# Patient Record
Sex: Female | Born: 1995 | Race: Black or African American | Hispanic: No | Marital: Married | Smoking: Current every day smoker
Health system: Southern US, Community
[De-identification: ages and names within clinical notes are randomized; demographics above are authoritative.]

## PROBLEM LIST (undated history)

## (undated) DIAGNOSIS — K469 Unspecified abdominal hernia without obstruction or gangrene: Secondary | ICD-10-CM

## (undated) DIAGNOSIS — F410 Panic disorder [episodic paroxysmal anxiety] without agoraphobia: Secondary | ICD-10-CM

---

## 2017-08-07 ENCOUNTER — Ambulatory Visit: Payer: 59 | Admitting: Podiatry

## 2017-08-07 DIAGNOSIS — L600 Ingrowing nail: Secondary | ICD-10-CM | POA: Diagnosis not present

## 2017-08-07 MED ORDER — GENTAMICIN SULFATE 0.1 % EX CREA: 1.0000 "application " | TOPICAL_CREAM | Freq: Three times a day (TID) | CUTANEOUS | 1 refills | Status: AC

## 2017-08-07 NOTE — Patient Instructions (Signed)

## 2017-08-13 NOTE — Progress Notes (Signed)
   Subjective: Patient presents today for evaluation of pain to the medial and lateral borders of the bilateral great toes that began several weeks ago. Patient is concerned for possible ingrown nail. She has been trimming the nails herself for treatment until the pain became too great to continue to do so. Wearing certain shoes increases the pain. Patient presents today for further treatment and evaluation.  No past medical history on file.  Objective:  General: Well developed, nourished, in no acute distress, alert and oriented x3   Dermatology: Skin is warm, dry and supple bilateral. Medial and lateral borders of bilateral great toes appears to be erythematous with evidence of an ingrowing nail. Pain on palpation noted to the border of the nail fold. The remaining nails appear unremarkable at this time. There are no open sores, lesions.  Vascular: Dorsalis Pedis artery and Posterior Tibial artery pedal pulses palpable. No lower extremity edema noted.   Neruologic: Grossly intact via light touch bilateral.  Musculoskeletal: Muscular strength within normal limits in all groups bilateral. Normal range of motion noted to all pedal and ankle joints.   Assesement: #1 Paronychia with ingrowing nail medial and lateral borders bilateral great toes  #2 Pain in toe #3 Incurvated nail  Plan of Care:  1. Patient evaluated.  2. Discussed treatment alternatives and plan of care. Explained nail avulsion procedure and post procedure course to patient. 3. Patient opted for permanent partial nail avulsion to medial and lateral borders bilateral great toes.  4. Prior to procedure, local anesthesia infiltration utilized using 3 ml of a 50:50 mixture of 2% plain lidocaine and 0.5% plain marcaine in a normal hallux block fashion and a betadine prep performed.  5. Partial permanent nail avulsion with chemical matrixectomy performed using 3x30sec applications of phenol followed by alcohol flush.  6. Light  dressing applied. 7. Prescription for gentamicin cream provided to patient.  8. Return to clinic in 2 weeks.   Felecia ShellingBrent M. Teagon Kron, DPM [facility]  Dr. Felecia ShellingBrent M. Zachory Mangual, DPM    [address]                                        [place], [place] [address]                Office [phone]  Fax [phone]

## 2017-08-21 ENCOUNTER — Encounter: Payer: 59 | Admitting: Podiatry

## 2017-08-22 NOTE — Progress Notes (Signed)
This encounter was created in error - please disregard.

## 2018-01-23 ENCOUNTER — Telehealth: Payer: Self-pay | Admitting: Neurology

## 2018-01-23 ENCOUNTER — Ambulatory Visit: Payer: 59 | Admitting: Neurology

## 2018-01-23 NOTE — Telephone Encounter (Signed)
This patient did not show for a NP appointment today. 

## 2018-01-24 ENCOUNTER — Encounter: Payer: Self-pay | Admitting: Neurology

## 2018-01-27 ENCOUNTER — Emergency Department (HOSPITAL_COMMUNITY)
Admission: EM | Admit: 2018-01-27 | Discharge: 2018-01-27 | Disposition: A | Discharge status: Home / Self Care | Payer: 59 | Attending: Emergency Medicine | Admitting: Emergency Medicine

## 2018-01-27 ENCOUNTER — Other Ambulatory Visit: Payer: Self-pay

## 2018-01-27 ENCOUNTER — Emergency Department (HOSPITAL_COMMUNITY): Payer: 59

## 2018-01-27 ENCOUNTER — Encounter (HOSPITAL_COMMUNITY): Payer: Self-pay | Admitting: Emergency Medicine

## 2018-01-27 DIAGNOSIS — M2550 Pain in unspecified joint: Secondary | ICD-10-CM

## 2018-01-27 DIAGNOSIS — M25562 Pain in left knee: Secondary | ICD-10-CM | POA: Diagnosis not present

## 2018-01-27 DIAGNOSIS — R531 Weakness: Secondary | ICD-10-CM | POA: Diagnosis present

## 2018-01-27 DIAGNOSIS — Z79899 Other long term (current) drug therapy: Secondary | ICD-10-CM | POA: Diagnosis not present

## 2018-01-27 DIAGNOSIS — D72829 Elevated white blood cell count, unspecified: Secondary | ICD-10-CM | POA: Insufficient documentation

## 2018-01-27 HISTORY — DX: Unspecified abdominal hernia without obstruction or gangrene: K46.9

## 2018-01-27 HISTORY — DX: Panic disorder (episodic paroxysmal anxiety): F41.0

## 2018-01-27 LAB — BASIC METABOLIC PANEL
Anion gap: 9 (ref 5–15)
BUN: 9 mg/dL (ref 6–20)
CHLORIDE: 106 mmol/L (ref 98–111)
CO2: 22 mmol/L (ref 22–32)
Calcium: 8.9 mg/dL (ref 8.9–10.3)
Creatinine, Ser: 0.86 mg/dL (ref 0.44–1.00)
GFR calc non Af Amer: >60 mL/min (ref >60)
Glucose, Bld: 93 mg/dL (ref 70–99)
POTASSIUM: 4 mmol/L (ref 3.5–5.1)
SODIUM: 137 mmol/L (ref 135–145)

## 2018-01-27 LAB — CBC WITH DIFFERENTIAL/PLATELET
Abs Immature Granulocytes: 0.06 10*3/uL (ref 0.00–0.07)
Basophils Absolute: 0 10*3/uL (ref 0.0–0.1)
Basophils Relative: 0 %
EOS PCT: 0 %
Eosinophils Absolute: 0 10*3/uL (ref 0.0–0.5)
HCT: 42 % (ref 36.0–46.0)
Hemoglobin: 13.7 g/dL (ref 12.0–15.0)
Immature Granulocytes: 0 %
Lymphocytes Relative: 18 %
Lymphs Abs: 2.4 10*3/uL (ref 0.7–4.0)
MCH: 30.1 pg (ref 26.0–34.0)
MCHC: 32.6 g/dL (ref 30.0–36.0)
MCV: 92.3 fL (ref 80.0–100.0)
Monocytes Absolute: 1.1 10*3/uL — ABNORMAL HIGH (ref 0.1–1.0)
Monocytes Relative: 8 %
Neutro Abs: 9.9 10*3/uL — ABNORMAL HIGH (ref 1.7–7.7)
Neutrophils Relative %: 74 %
PLATELETS: 280 10*3/uL (ref 150–400)
RBC: 4.55 MIL/uL (ref 3.87–5.11)
RDW: 11.7 % (ref 11.5–15.5)
WBC: 13.6 10*3/uL — ABNORMAL HIGH (ref 4.0–10.5)
nRBC: 0 % (ref 0.0–0.2)

## 2018-01-27 LAB — URINALYSIS, ROUTINE W REFLEX MICROSCOPIC
Bilirubin Urine: NEGATIVE
Glucose, UA: NEGATIVE mg/dL
Hgb urine dipstick: NEGATIVE
Ketones, ur: 5 mg/dL — AB
Leukocytes, UA: NEGATIVE
Nitrite: NEGATIVE
Protein, ur: 100 mg/dL — AB
Specific Gravity, Urine: 1.028 (ref 1.005–1.030)
pH: 7 (ref 5.0–8.0)

## 2018-01-27 LAB — I-STAT BETA HCG BLOOD, ED (MC, WL, AP ONLY): I-stat hCG, quantitative: <5 m[IU]/mL (ref <5)

## 2018-01-27 LAB — TSH: TSH: 1.667 u[IU]/mL (ref 0.350–4.500)

## 2018-01-27 NOTE — Discharge Instructions (Addendum)
Please see the information and instructions below regarding your visit.  Your diagnoses today include:  1. Generalized weakness   2. Polyarthralgia   3. Leukocytosis, unspecified type     Tests performed today include: See side panel of your discharge paperwork for testing performed today. Vital signs are listed at the bottom of these instructions.   Your work-up is very reassuring today.  Your thyroid function is normal.  Your electrolytes are normal.  Your white blood cell count is slightly high, which can be a stress reaction, however I would like this rechecked by your primary care provider.  It is 13.6 today.  In the setting of no infection, this is unlikely to represent infectious process.  Medications prescribed:    Take any prescribed medications only as prescribed, and any over the counter medications only as directed on the packaging.  Please do not self taper or discontinue Wellbutrin without first talking to your primary care provider.  Home care instructions:  Please follow any educational materials contained in this packet.   Please wear the knee sleeve at all times with activity.  This is hot to help support the knee.  I listed a contact of an orthopedic physician to follow-up with you should you have ongoing pain or problems with your knee in 1 week.  Follow-up instructions: Please follow-up with your primary care provider in this coming week for further evaluation of your symptoms if they are not completely improved.   Please follow up with orthopedic in one week for knee pain.  Return instructions:  Please return to the Emergency Department if you experience worsening symptoms.  Please return to the emergency department if you develop any worsening symptoms, swelling or redness of the joints, fever with your symptoms, or passing out spells where you completely lose consciousness. Please return if you have any other emergent concerns.  Additional  Information:   Your vital signs today were: BP 135/71 (BP Location: Right Arm)    Pulse 74    Temp 98.4 F (36.9 C) (Oral)    Resp 16    Ht 5\' 2"  (1.575 m)    Wt 67.6 kg    LMP 12/27/2017    SpO2 99%    BMI 27.25 kg/m  If your blood pressure (BP) was elevated on multiple readings during this visit above 130 for the top number or above 80 for the bottom number, please have this repeated by your primary care provider within one month. --------------  Thank you for allowing us to participate in your care today.

## 2018-01-27 NOTE — ED Notes (Signed)
Pt stable and ambulatory for discharge, states understanding follow up.  

## 2018-01-27 NOTE — ED Triage Notes (Addendum)
Per Pt 3 weeks ago pt started on Wellbutrin for ADHD, pt has h/s of anxiety attacks, pt has been feeling fatigued, anxious, vision is blurry, scabs on face, hot flashes, and stomach pain.

## 2018-01-27 NOTE — ED Provider Notes (Addendum)
[facility] EMERGENCY DEPARTMENT Provider Note   CSN: [id number] Arrival date & time: 01/27/18  1619     History   Chief Complaint Chief Complaint  Patient presents with  . Fatigue    HPI Jennell Janosik is a 22 y.o. female.  HPI  Patient is a 22 year old female with a history of ADHD presenting for generalized weakness, episodes of "blacking out" that she describes as disorientation and blinking, palpitations, irritability, crying spells, nausea without vomiting, vision changes in the setting of her "blacking out" spells.  Patient reports that all the symptoms began after starting Wellbutrin 3 weeks ago.  She reports that she was started on this for ADHD.  Patient reports she has previously been on 2 other antidepressants, hydroxyzine and Lexapro.  Patient denies any history of seizures or syncope.  Patient is also noting joint pain in several of her joints.  She denies any erythema or edema, but does note an injury to her left knee when her left knee "gave out" a couple days ago, hearing a "popping" inside her knee. Patient has been limping ever since. She denies experiencing any syncopal episodes as result of the symptoms.  She reports that she was placed on Wellbutrin by primary care provider with whom she establish care at [facility].  Patient does report there is a chance she could be pregnant.  History reviewed. No pertinent past medical history.  There are no active problems to display for this patient.   History reviewed. No pertinent surgical history.   OB History   None      Home Medications    Prior to Admission medications   Medication Sig Start Date End Date Taking? Authorizing Provider  gentamicin cream (GARAMYCIN) 0.1 % Apply 1 application topically 3 (three) times daily. 08/07/17   Felecia Shelling, DPM    Family History No family history on file.  Social History Social History   Tobacco Use  . Smoking status: Not on file    Substance Use Topics  . Alcohol use: Not on file  . Drug use: Not on file     Allergies   Bentyl [dicyclomine]   Review of Systems Review of Systems  Constitutional: Positive for chills. Negative for fever.  HENT: Negative for congestion, rhinorrhea, sinus pain and sore throat.   Eyes: Negative for visual disturbance.  Respiratory: Negative for cough, chest tightness and shortness of breath.   Cardiovascular: Negative for chest pain and palpitations.  Gastrointestinal: Positive for nausea. Negative for abdominal pain, constipation, diarrhea and vomiting.  Genitourinary: Negative for dysuria and flank pain.  Musculoskeletal: Negative for back pain and myalgias.  Skin: Negative for rash.  Neurological: Positive for light-headedness and headaches. Negative for dizziness, seizures, syncope and numbness.     Physical Exam Updated Vital Signs Ht 5\' 2"  (1.575 m)   Wt 67.6 kg   LMP 12/28/2017   BMI 27.25 kg/m   Physical Exam  Constitutional: She appears well-developed and well-nourished. No distress.  HENT:  Head: Normocephalic and atraumatic.  Mouth/Throat: Oropharynx is clear and moist.  Eyes: Pupils are equal, round, and reactive to light. Conjunctivae and EOM are normal.  Neck: Normal range of motion. Neck supple.  Cardiovascular: Normal rate, regular rhythm, S1 normal and S2 normal.  No murmur heard. Pulmonary/Chest: Effort normal and breath sounds normal. She has no wheezes. She has no rales.  Abdominal: Soft. She exhibits no distension. There is no tenderness. There is no guarding.  Musculoskeletal: Normal range  of motion. She exhibits no edema or deformity.  Left knee with tenderness to palpation of medial joint. Full ROM. No joint line tenderness. No joint effusion or swelling appreciated. No abnormal alignment or patellar mobility. No bruising, erythema or warmth overlaying the joint. No varus/valgus laxity. Negative drawer's, Lachman's and McMurray's.  No crepitus.   2+ DP pulses bilaterally. All compartments are soft. Sensation intact distal to injury.   Neurological: She is alert.  Cranial nerves grossly intact. Patient moves extremities symmetrically and with good coordination.  Skin: Skin is warm and dry. No rash noted. No erythema.  Psychiatric: She has a normal mood and affect. Her behavior is normal. Judgment and thought content normal.  Nursing note and vitals reviewed.    ED Treatments / Results  Labs (all labs ordered are listed, but only abnormal results are displayed) Labs Reviewed - No data to display  EKG None  Radiology No results found.  Procedures Procedures (including critical care time)  Medications Ordered in ED Medications - No data to display   Initial Impression / Assessment and Plan / ED Course  I have reviewed the triage vital signs and the nursing notes.  Pertinent labs & imaging results that were available during my care of the patient were reviewed by me and considered in my medical decision making (see chart for details).  Clinical Course as of Jan 27 2002  Sat Jan 27, 2018  1731 Does not appear c/w infection.   Urinalysis, Routine w reflex microscopic(!) [AM]    Clinical Course User Index [AM] Aviva KluverMurray, Chani Ghanem B, PA-C    Patient nontoxic-appearing, afebrile, hemodynamically stable, and in no acute distress.  Differential diagnosis includes medication reaction to Wellbutrin, electrolyte abnormality, pregnancy, viral illness, depression.  Lab work is largely unrevealing.  Patient has slight leukocytosis to 13.6.  No prior for comparison.  No focal infectious symptoms.  Recommended follow-up with PCP for this finding.  Electrolytes are normal.  TSH is within normal limits.  Pregnancy test negative. Urinalysis not consistent with infection.  X-ray of the left knee is without acute bony abnormality.  Patient placed in knee sleeve.  At this time, I recommended to patient that given that her symptoms seem to  correlate with starting Wellbutrin, recommend following up with her primary care provider who prescribed this closely for a symptom recheck and discussion of this is the best medication for her.  I discussed with the patient that this should not be self discontinued or discontinued abruptly, as that can cause worsening mental status.  Recommended follow-up for recheck of CBC.  Return precautions given for any worsening symptoms, swelling or redness of the joints, fever with your symptoms, syncope or presyncope.  Patient is in understanding and agrees with the plan of care.  Final Clinical Impressions(s) / ED Diagnoses   Final diagnoses:  Generalized weakness  Polyarthralgia  Leukocytosis, unspecified type    ED Discharge Orders    None       Elisha PonderMurray, Ayad Nieman B, PA-C 01/28/18 0028    Elisha PonderMurray, Lola Czerwonka B, PA-C 01/28/18 Ermalinda Barrios0028    Pickering, Nathan, MD 01/29/18 [phone]

## 2018-08-29 ENCOUNTER — Emergency Department (HOSPITAL_COMMUNITY)
Admission: EM | Admit: 2018-08-29 | Discharge: 2018-08-29 | Disposition: A | Discharge status: Home / Self Care | Payer: No Typology Code available for payment source | Attending: Emergency Medicine | Admitting: Emergency Medicine

## 2018-08-29 ENCOUNTER — Encounter (HOSPITAL_COMMUNITY): Payer: Self-pay | Admitting: *Deleted

## 2018-08-29 ENCOUNTER — Other Ambulatory Visit: Payer: Self-pay

## 2018-08-29 DIAGNOSIS — T782XXA Anaphylactic shock, unspecified, initial encounter: Secondary | ICD-10-CM | POA: Insufficient documentation

## 2018-08-29 DIAGNOSIS — F17200 Nicotine dependence, unspecified, uncomplicated: Secondary | ICD-10-CM | POA: Insufficient documentation

## 2018-08-29 DIAGNOSIS — Z79899 Other long term (current) drug therapy: Secondary | ICD-10-CM | POA: Insufficient documentation

## 2018-08-29 DIAGNOSIS — L509 Urticaria, unspecified: Secondary | ICD-10-CM | POA: Diagnosis present

## 2018-08-29 LAB — I-STAT BETA HCG BLOOD, ED (MC, WL, AP ONLY): I-stat hCG, quantitative: <5 m[IU]/mL (ref <5)

## 2018-08-29 LAB — CBC WITH DIFFERENTIAL/PLATELET
Abs Immature Granulocytes: 0.15 10*3/uL — ABNORMAL HIGH (ref 0.00–0.07)
Basophils Absolute: 0.1 10*3/uL (ref 0.0–0.1)
Basophils Relative: 1 %
Eosinophils Absolute: 0.1 10*3/uL (ref 0.0–0.5)
Eosinophils Relative: 1 %
HCT: 45.9 % (ref 36.0–46.0)
Hemoglobin: 15.1 g/dL — ABNORMAL HIGH (ref 12.0–15.0)
Immature Granulocytes: 1 %
Lymphocytes Relative: 27 %
Lymphs Abs: 2.8 10*3/uL (ref 0.7–4.0)
MCH: 30.8 pg (ref 26.0–34.0)
MCHC: 32.9 g/dL (ref 30.0–36.0)
MCV: 93.5 fL (ref 80.0–100.0)
Monocytes Absolute: 1.2 10*3/uL — ABNORMAL HIGH (ref 0.1–1.0)
Monocytes Relative: 11 %
Neutro Abs: 6.1 10*3/uL (ref 1.7–7.7)
Neutrophils Relative %: 59 %
Platelets: 287 10*3/uL (ref 150–400)
RBC: 4.91 MIL/uL (ref 3.87–5.11)
RDW: 11.9 % (ref 11.5–15.5)
WBC: 10.4 10*3/uL (ref 4.0–10.5)
nRBC: 0 % (ref 0.0–0.2)

## 2018-08-29 LAB — BASIC METABOLIC PANEL
Anion gap: 11 (ref 5–15)
BUN: 10 mg/dL (ref 6–20)
CO2: 21 mmol/L — ABNORMAL LOW (ref 22–32)
Calcium: 9.2 mg/dL (ref 8.9–10.3)
Chloride: 105 mmol/L (ref 98–111)
Creatinine, Ser: 0.78 mg/dL (ref 0.44–1.00)
GFR calc Af Amer: >60 mL/min (ref >60)
GFR calc non Af Amer: >60 mL/min (ref >60)
Glucose, Bld: 85 mg/dL (ref 70–99)
Potassium: 3.7 mmol/L (ref 3.5–5.1)
Sodium: 137 mmol/L (ref 135–145)

## 2018-08-29 MED ORDER — FAMOTIDINE 20 MG PO TABS: 20.0000 mg | ORAL_TABLET | Freq: Two times a day (BID) | ORAL | 0 refills | Status: AC

## 2018-08-29 MED ORDER — ONDANSETRON HCL 4 MG/2ML IJ SOLN
4.0000 mg | Freq: Once | INTRAMUSCULAR | Status: AC
  Administered 2018-08-29: 4 mg via INTRAVENOUS
  Filled 2018-08-29: qty 2

## 2018-08-29 MED ORDER — EPINEPHRINE 0.3 MG/0.3ML IJ SOAJ: 0.3000 mg | INTRAMUSCULAR | 0 refills | Status: AC | PRN

## 2018-08-29 MED ORDER — DEXAMETHASONE SODIUM PHOSPHATE 10 MG/ML IJ SOLN
10.0000 mg | Freq: Once | INTRAMUSCULAR | Status: AC
  Administered 2018-08-29: 10 mg via INTRAVENOUS
  Filled 2018-08-29: qty 1

## 2018-08-29 MED ORDER — SODIUM CHLORIDE 0.9 % IV SOLN
INTRAVENOUS | Status: DC
  Administered 2018-08-29: 15:00:00 via INTRAVENOUS

## 2018-08-29 MED ORDER — FAMOTIDINE IN NACL 20-0.9 MG/50ML-% IV SOLN
20.0000 mg | Freq: Once | INTRAVENOUS | Status: AC
  Administered 2018-08-29: 20 mg via INTRAVENOUS
  Filled 2018-08-29: qty 50

## 2018-08-29 MED ORDER — DIPHENHYDRAMINE HCL 25 MG PO TABS: 25.0000 mg | ORAL_TABLET | Freq: Four times a day (QID) | ORAL | 0 refills | Status: AC | PRN

## 2018-08-29 MED ORDER — SODIUM CHLORIDE 0.9 % IV BOLUS
1000.0000 mL | Freq: Once | INTRAVENOUS | Status: AC
  Administered 2018-08-29: 1000 mL via INTRAVENOUS

## 2018-08-29 MED ORDER — PREDNISONE 20 MG PO TABS: 40.0000 mg | ORAL_TABLET | Freq: Every day | ORAL | 0 refills | Status: AC

## 2018-08-29 MED ORDER — EPINEPHRINE 0.3 MG/0.3ML IJ SOAJ
0.3000 mg | Freq: Once | INTRAMUSCULAR | Status: AC
  Administered 2018-08-29: 0.3 mg via INTRAMUSCULAR
  Filled 2018-08-29: qty 0.3

## 2018-08-29 MED ORDER — DIPHENHYDRAMINE HCL 50 MG/ML IJ SOLN
25.0000 mg | Freq: Once | INTRAMUSCULAR | Status: AC
  Administered 2018-08-29: 25 mg via INTRAVENOUS
  Filled 2018-08-29: qty 1

## 2018-08-29 NOTE — ED Provider Notes (Signed)
[facility]-EMERGENCY DEPT Provider Note   CSN: [id number] Arrival date & time: 08/29/18  1245  History   Chief Complaint Chief Complaint  Patient presents with  . Allergic Reaction    HPI Maureen Medina is a 23 y.o. female with past medical history significant for multiple food allergies with anaphylaxis who presents for evaluation of allergic reaction.  Patient states she ate a chocolate cookie approximately 1 hour PTA and developed hives, shortness of breath, sensation of throat closing.  She has had one episode of emesis with persistent nausea.  She is also had generalized abdominal pain.  States she has prior history of anaphylaxis however does not have an EpiPen at home.  States she did take Benadryl, 1 tablet ETA.  Denies fever, chills, chest pain, shortness of breath, diarrhea, dysuria.  Denies additional aggravating or alleviating factors.  Patient states symptoms have actually slightly improved since onset however she was concerned given her sensation of throat closing.  History obtained from patient and past medical records. No interpreter was used.   HPI  Past Medical History:  Diagnosis Date  . Anxiety attack   . Hernia, abdominal     There are no active problems to display for this patient.   History reviewed. No pertinent surgical history.   OB History   No obstetric history on file.      Home Medications    Prior to Admission medications   Medication Sig Start Date End Date Taking? Authorizing Provider  buPROPion (WELLBUTRIN XL) 150 MG 24 hr tablet Take 150 mg by mouth daily. 01/10/18   [provider]  diphenhydrAMINE (BENADRYL) 25 MG tablet Take 1 tablet (25 mg total) by mouth every 6 (six) hours as needed. 08/29/18   Elease Swarm A, PA-C  EPINEPHrine 0.3 mg/0.3 mL IJ SOAJ injection Inject 0.3 mLs (0.3 mg total) into the muscle as needed for anaphylaxis. 08/29/18   Letcher Schweikert A, PA-C  famotidine (PEPCID) 20 MG tablet Take  1 tablet (20 mg total) by mouth 2 (two) times daily. 08/29/18   Akera Snowberger A, PA-C  gentamicin cream (GARAMYCIN) 0.1 % Apply 1 application topically 3 (three) times daily. 08/07/17   Felecia ShellingEvans, Brent M, DPM  Multiple Vitamins-Minerals (MULTIVITAMIN WITH MINERALS) tablet Take 1 tablet by mouth daily.    [provider]  predniSONE (DELTASONE) 20 MG tablet Take 2 tablets (40 mg total) by mouth daily for 4 days. 08/29/18 09/02/18  Emileigh Kellett A, PA-C  Probiotic Product (PROBIOTIC PO) Take 1 capsule by mouth daily.    [provider]    Family History No family history on file.  Social History Social History   Tobacco Use  . Smoking status: Current Every Day Smoker  . Smokeless tobacco: Never Used  Substance Use Topics  . Alcohol use: Never    Frequency: Never  . Drug use: Never     Allergies   Bentyl [dicyclomine]   Review of Systems Review of Systems  Constitutional: Negative.   HENT:       Sensation of throat closing.  Eyes: Negative.   Respiratory: Positive for cough. Negative for apnea, choking, chest tightness, shortness of breath, wheezing and stridor.   Cardiovascular: Negative.   Gastrointestinal: Positive for abdominal pain, nausea and vomiting. Negative for abdominal distention, anal bleeding, blood in stool, constipation, diarrhea and rectal pain.  Genitourinary: Negative.   Musculoskeletal: Negative.   Skin: Positive for rash.  Neurological: Negative.   All other systems reviewed and are negative.  Physical Exam Updated Vital Signs BP 118/69 (BP Location: Right Arm)   Pulse (!) 106   Temp 98.9 F (37.2 C) (Oral)   Resp 17   LMP 08/15/2018   SpO2 100%   Physical Exam Vitals signs and nursing note reviewed.  Constitutional:      General: She is not in acute distress.    Appearance: She is not ill-appearing, toxic-appearing or diaphoretic.  HENT:     Head: Normocephalic and atraumatic.     Jaw: There is normal jaw occlusion.      Right Ear: Tympanic membrane, ear canal and external ear normal. There is no impacted cerumen. No hemotympanum. Tympanic membrane is not injected, scarred, perforated, erythematous, retracted or bulging.     Left Ear: Tympanic membrane, ear canal and external ear normal. There is no impacted cerumen. No hemotympanum. Tympanic membrane is not injected, scarred, perforated, erythematous, retracted or bulging.     Ears:     Comments: No Mastoid tenderness.    Nose:     Comments: Clear rhinorrhea and congestion to bilateral nares.  No sinus tenderness.    Mouth/Throat:     Comments: Posterior oropharynx clear.  Mucous membranes moist.  Tonsils without erythema or exudate.  Uvula midline without deviation.  No evidence of PTA or RPA.  No drooling, dysphasia or trismus.  Phonation normal.  No oral swelling.  No evidence of angioedema or airway compromise. Neck:     Musculoskeletal: Full passive range of motion without pain and normal range of motion.     Trachea: Trachea and phonation normal.     Comments: No Neck stiffness or neck rigidity. No cervical lymphadenopathy. Cardiovascular:     Pulses: Normal pulses.     Heart sounds: Normal heart sounds.     Comments: No murmurs rubs or gallops. Pulmonary:     Effort: Pulmonary effort is normal.     Breath sounds: Normal breath sounds and air entry.     Comments: Clear to auscultation bilaterally without wheeze, rhonchi or rales.  No accessory muscle usage.  Able speak in full sentences. Abdominal:     Tenderness: There is no abdominal tenderness. There is no guarding or rebound.     Comments: Soft, nontender without rebound or guarding.  No CVA tenderness.  Musculoskeletal:     Comments: Moves all 4 extremities without difficulty.  Lower extremities without edema, erythema or warmth.  Skin:    Comments: Brisk capillary refill.  Small areas of urticaria to abdomen and back  Neurological:     Mental Status: She is alert.     Comments: Ambulatory in  department without difficulty.  Cranial nerves II through XII grossly intact.  No facial droop.  No aphasia.    ED Treatments / Results  Labs (all labs ordered are listed, but only abnormal results are displayed) Labs Reviewed  BASIC METABOLIC PANEL - Abnormal; Notable for the following components:      Result Value   CO2 21 (*)    All other components within normal limits  CBC WITH DIFFERENTIAL/PLATELET - Abnormal; Notable for the following components:   Hemoglobin 15.1 (*)    Monocytes Absolute 1.2 (*)    Abs Immature Granulocytes 0.15 (*)    All other components within normal limits  I-STAT BETA HCG BLOOD, ED ([facility], [facility], [facility] ONLY)    EKG None  Radiology No results found.  Procedures .Critical Care Performed by: Nettie Elm, PA-C Authorized by: Nettie Elm, PA-C   Critical  care provider statement:    Critical care time (minutes):  31   Critical care was necessary to treat or prevent imminent or life-threatening deterioration of the following conditions: Anaphylaxis.   Critical care was time spent personally by me on the following activities:  Discussions with consultants, evaluation of patient's response to treatment, examination of patient, ordering and performing treatments and interventions, ordering and review of laboratory studies, ordering and review of radiographic studies, pulse oximetry, re-evaluation of patient's condition, obtaining history from patient or surrogate and review of old charts   (including critical care time)  Medications Ordered in ED Medications  sodium chloride 0.9 % bolus 1,000 mL (0 mLs Intravenous Stopped 08/29/18 1523)    And  0.9 %  sodium chloride infusion ( Intravenous New Bag/Given (Non-Interop) 08/29/18 1524)  diphenhydrAMINE (BENADRYL) injection 25 mg (25 mg Intravenous Given 08/29/18 1340)  EPINEPHrine (EPI-PEN) injection 0.3 mg (0.3 mg Intramuscular Given 08/29/18 1329)  ondansetron (ZOFRAN) injection 4 mg (4 mg Intravenous Given  08/29/18 1339)  dexamethasone (DECADRON) injection 10 mg (10 mg Intravenous Given 08/29/18 1338)  famotidine (PEPCID) IVPB 20 mg premix (0 mg Intravenous Stopped 08/29/18 1406)   Initial Impression / Assessment and Plan / ED Course  I have reviewed the triage vital signs and the nursing notes.  Pertinent labs & imaging results that were available during my care of the patient were reviewed by me and considered in my medical decision making (see chart for details).  [id number]: 23 year old female presents for evaluation of anaphylactic reaction.  Has used EpiPen previously however does not have one at home.  Patient states she chocolate chip cookies earlier today which she did not know that she had an allergy to.  Incident occurred 1 hour PTA.  Patient with sensation of throat closing, cough, nausea, 1 episode of emesis and generalized abdominal pain.  Took Benadryl PTA.  Patient states symptoms feel improvement since she took Benadryl however still has a sensation as if her throat is closing.  Heart and lungs clear.  She speaks in full sentences without difficulty.  No evidence of oral swelling or facial swelling.  She does have multiple areas of urticaria.  Abdomen soft, nontender without rebound or guarding.  No stridor.  No neck stiffness or neck rigidity.  Given multitude of symptoms given EpiPen, Benadryl, Pepcid, steroids.  1350: Patient given Epipen for anaphylactic symptoms. Patent airway without compromise. No longer feels tightness in throat.  1430: On reevaluation patient with significant symptom improvement.  Still has some minor urticaria however with moderate improvement.  Her airway is patent.  She has no stridor.  Heart and lungs clear.  She is without tachycardia, tachypnea or hypoxia.  Patient states her throat no longer feels like it is tight.  1530: On reevaluation patient tolerating p.o. intake without difficulty.  Airway patent.  She is ambulatory without difficulty.  Heart and lungs clear.   Urticaria has completely resolved.  1700: On reevaluation patient stable.  She is without any current complaints.  She has no episodes of emesis in ED.  On reevaluation abdomen soft, nontender without rebound or guarding.  No airway compromise.  Patient has been observed in ED for 4 hours.  She is currently without symptoms.  Will DC home with EpiPen, steroids, Pepcid and Benadryl.  Discussed return precautions with patient.  Patient voiced understanding agreeable to follow-up.  Patient hemodynamically stable.  The patient has been appropriately medically screened and/or stabilized in the ED. I have low suspicion for any  other emergent medical condition which would require further screening, evaluation or treatment in the ED or require inpatient management.  Patient is hemodynamically stable and in no acute distress.  Patient able to ambulate in department prior to ED.  Evaluation does not show acute pathology that would require ongoing or additional emergent interventions while in the emergency department or further inpatient treatment.  I have discussed the diagnosis with the patient and answered all questions.  Patient has no further complaints prior to discharge.  Patient is comfortable with plan discussed in room and is stable for discharge at this time.  I have discussed strict return precautions for returning to the emergency department.  Patient was encouraged to follow-up with PCP/specialist refer to at discharge.     Final Clinical Impressions(s) / ED Diagnoses   Final diagnoses:  Anaphylaxis, initial encounter    ED Discharge Orders         Ordered    EPINEPHrine 0.3 mg/0.3 mL IJ SOAJ injection  As needed     08/29/18 1712    predniSONE (DELTASONE) 20 MG tablet  Daily     08/29/18 1712    famotidine (PEPCID) 20 MG tablet  2 times daily     08/29/18 1712    diphenhydrAMINE (BENADRYL) 25 MG tablet  Every 6 hours PRN     08/29/18 1712           Kyion Gautier A, PA-C 08/29/18  1723    Mancel BaleWentz, Elliott, MD 08/29/18 [phone]

## 2018-08-29 NOTE — ED Triage Notes (Signed)
Pt complains of itching, facial swelling, and throat swelling. Pt states she has hx of food allergies and has been having worsening symptoms over the past month. The current episode of facial itching and throat swelling started 1.5 hours ago, after eating a chocolate chip cookie. Pt states her symptoms started improving on the drive to ED.

## 2018-08-29 NOTE — Discharge Instructions (Signed)
Take the medicines as prescribed.  If you develop shortness of breath, sensation of throat closing presents emergency department for evaluation.

## 2018-08-29 NOTE — ED Notes (Signed)
Patient ambulating to the restroom and states her symptoms have improved.

## 2018-10-02 ENCOUNTER — Ambulatory Visit: Payer: 59 | Admitting: Pediatrics

## 2019-04-30 ENCOUNTER — Emergency Department (HOSPITAL_COMMUNITY): Admission: EM | Admit: 2019-04-30 | Discharge: 2019-04-30 | Discharge status: Against Medical Advice | Payer: 59

## 2019-04-30 ENCOUNTER — Other Ambulatory Visit: Payer: Self-pay

## 2019-07-22 IMAGING — DX DG KNEE COMPLETE 4+V*L*
4 series · 4 of 4 positions shown · non-contrast
Comparison: None.

CLINICAL DATA: 2 days ago pt twisted knee while getting groceries
out of the car. Pt is having pain on the medial side of her knee
next to the patella. Pt is having most of her pain while trying to
walk and stand. Pt has had no surgeries on that knee. Knee injury

EXAM:
LEFT KNEE - COMPLETE 4+ VIEW

[knee ap]
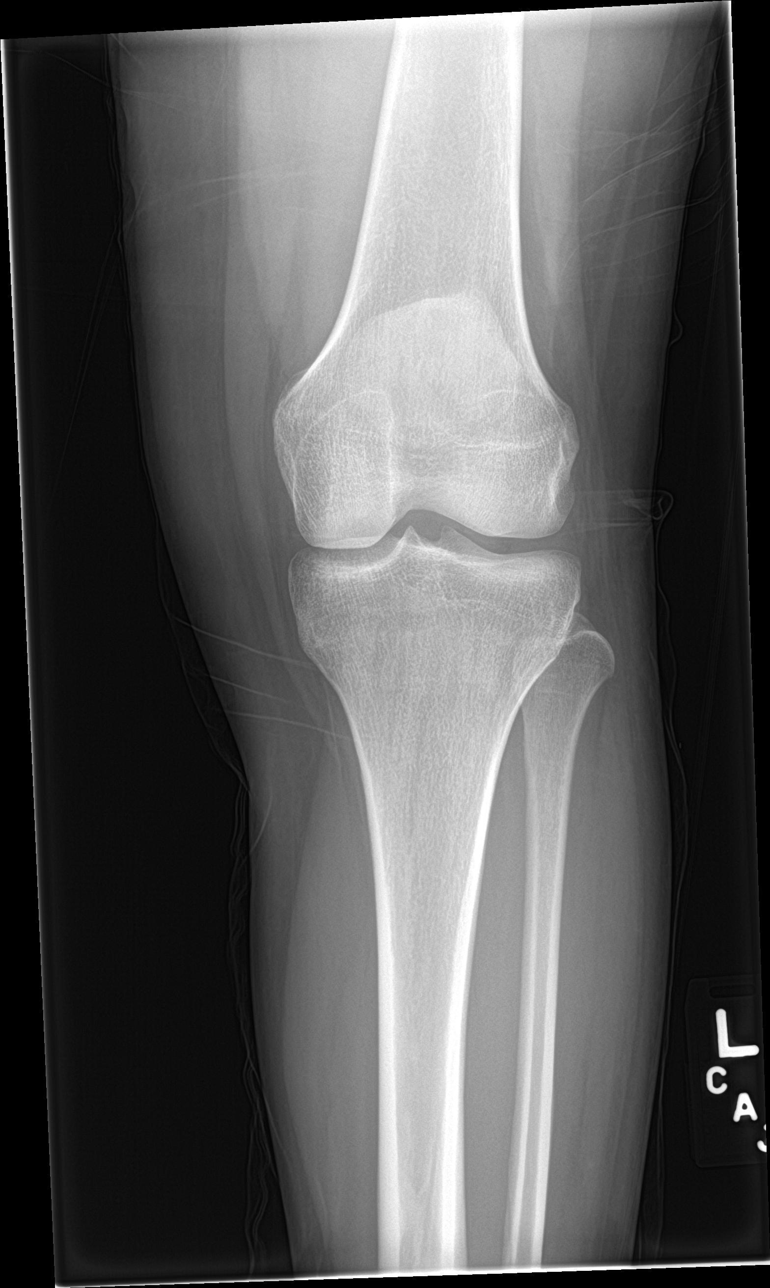

[knee lat]
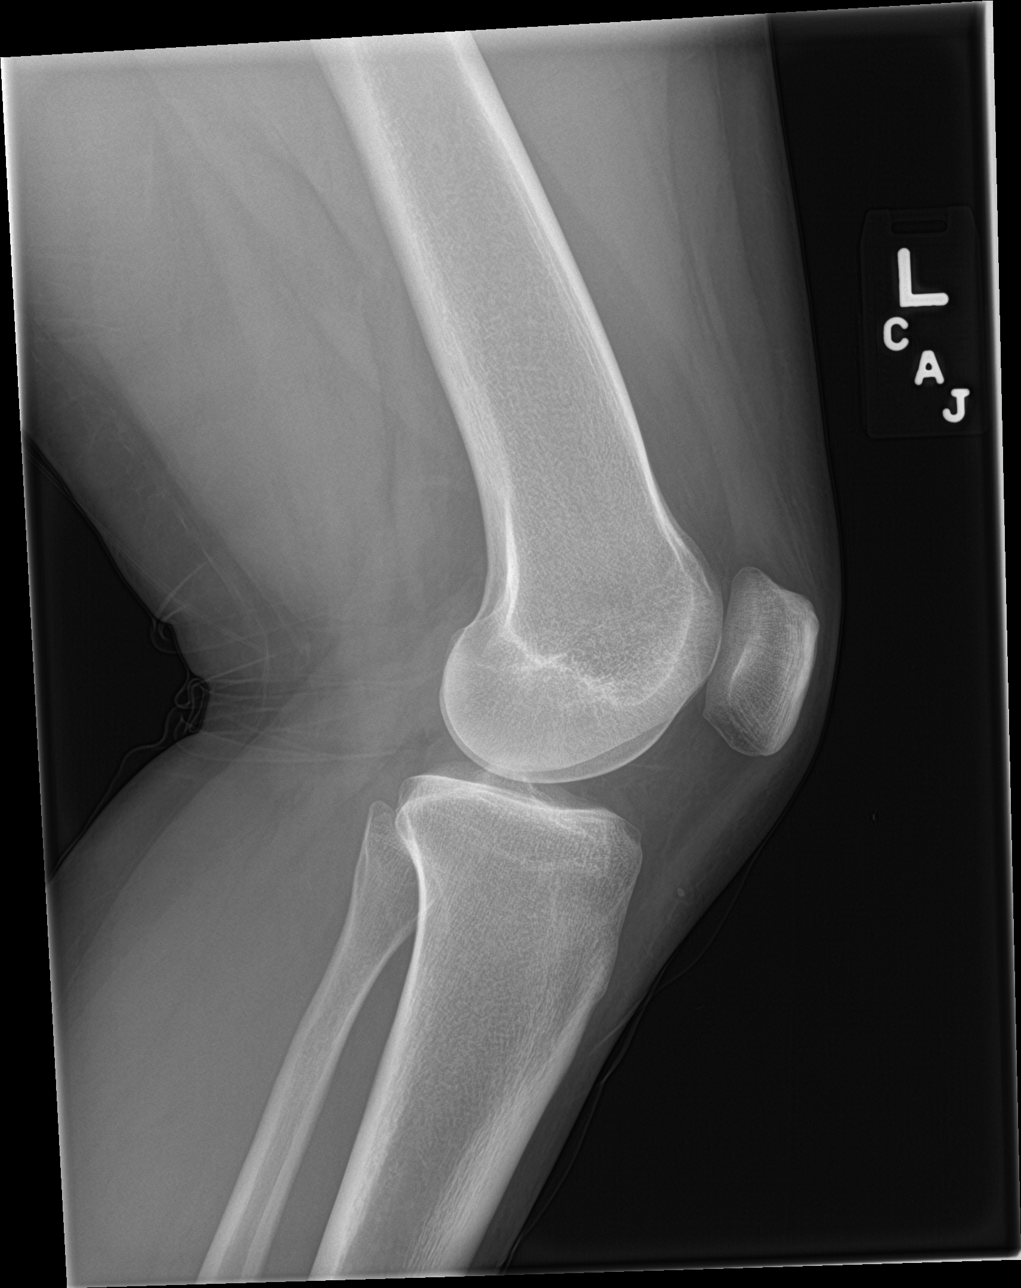

[knee obl (1 of 2)]
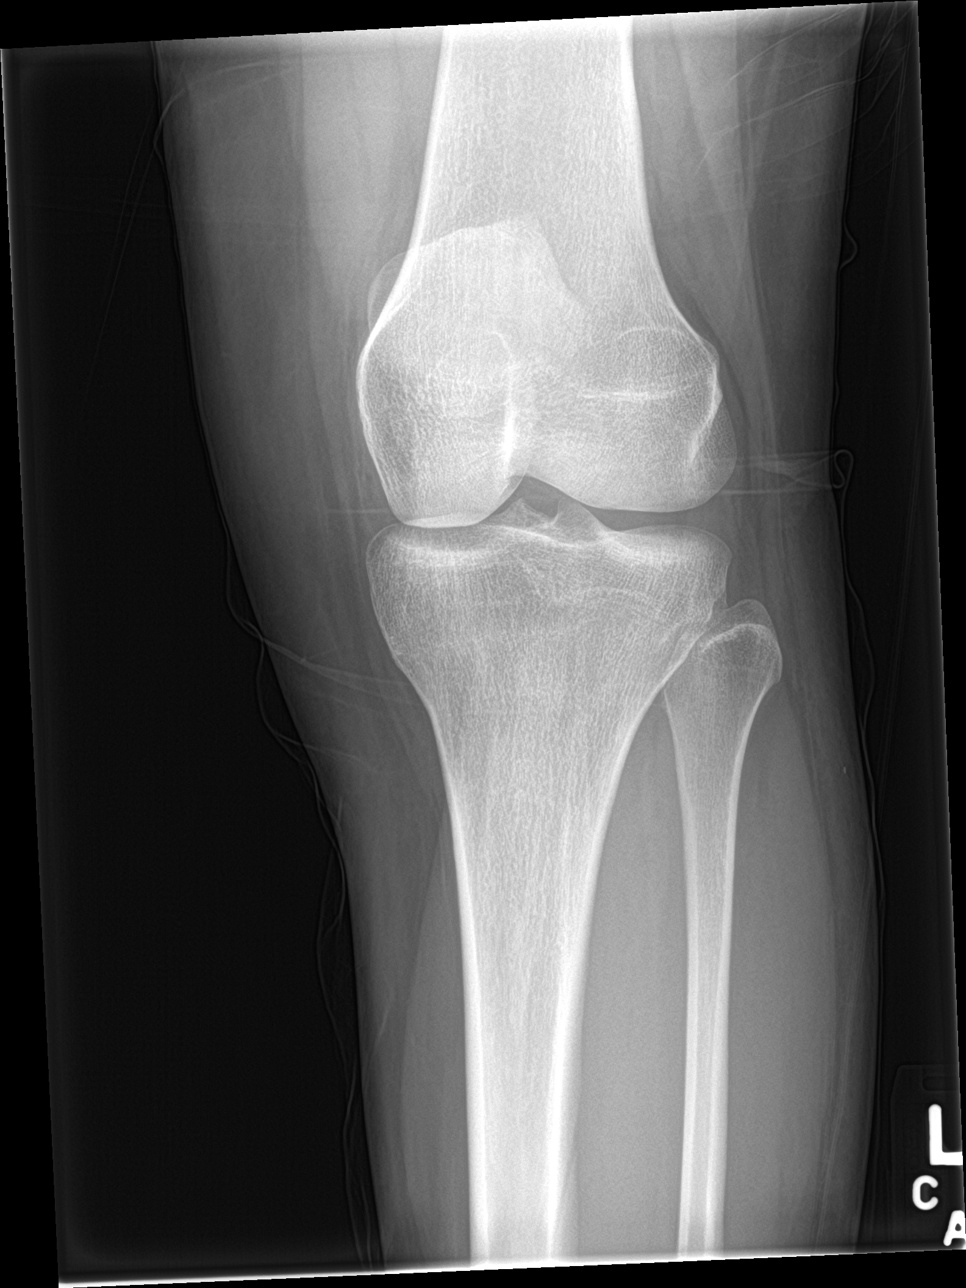

[knee obl (2 of 2)]
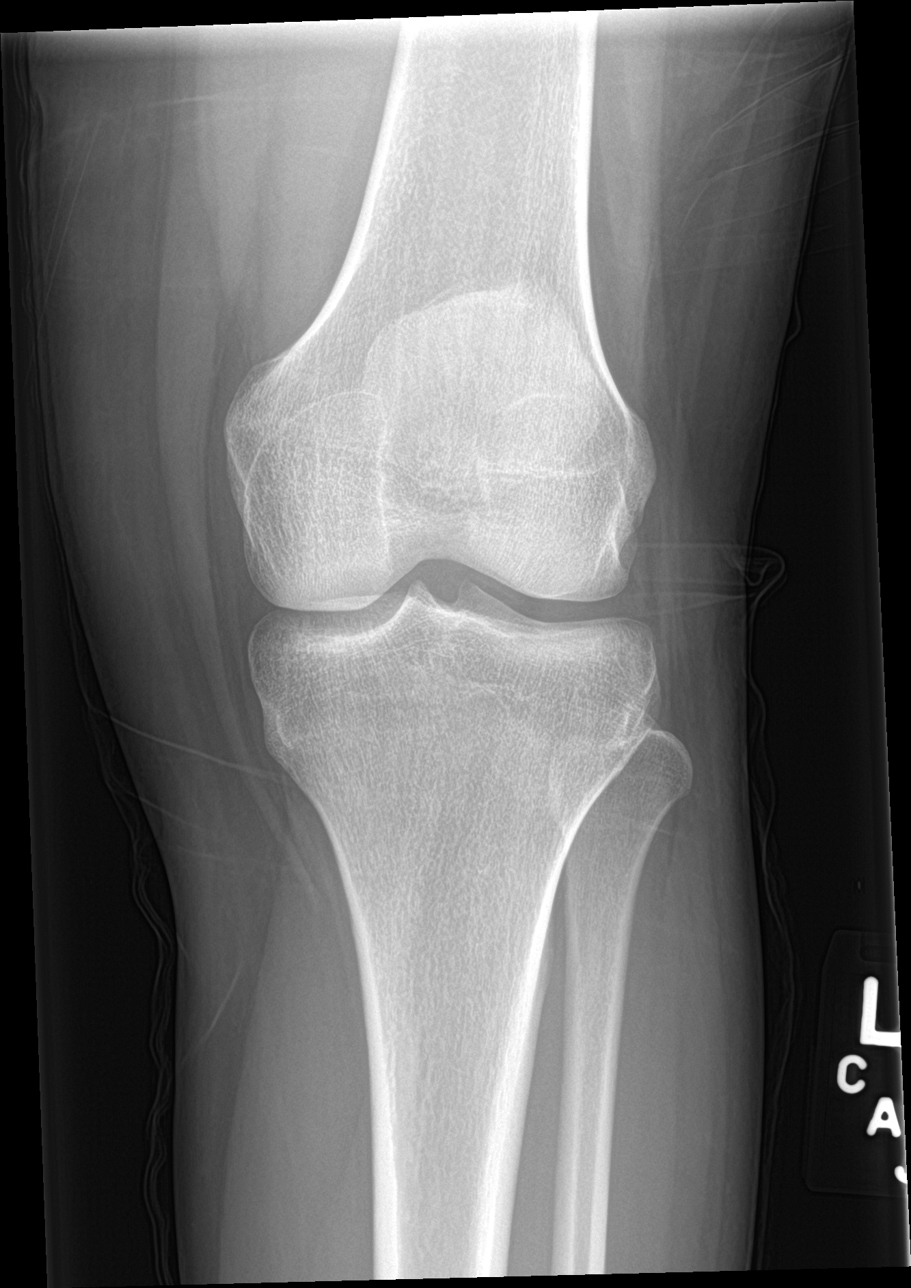

[4 of 4 positions shown; findings below may reference images not displayed]

FINDINGS: No fracture of the proximal tibia or distal femur. Patella is
normal. No joint effusion.
IMPRESSION: No fracture or dislocation.
